# Patient Record
Sex: Female | Born: 1990 | Race: White | Hispanic: No | Marital: Single
Health system: Southern US, Community
[De-identification: ages and names within clinical notes are randomized; demographics above are authoritative.]

---

## 2017-01-03 ENCOUNTER — Emergency Department (HOSPITAL_COMMUNITY)
Admission: EM | Admit: 2017-01-03 | Discharge: 2017-01-05 | Disposition: A | Discharge status: Home / Self Care | Payer: Self-pay | Attending: Emergency Medicine | Admitting: Emergency Medicine

## 2017-01-03 ENCOUNTER — Emergency Department (HOSPITAL_COMMUNITY): Payer: Self-pay

## 2017-01-03 ENCOUNTER — Encounter (HOSPITAL_COMMUNITY): Payer: Self-pay

## 2017-01-03 DIAGNOSIS — F19951 Other psychoactive substance use, unspecified with psychoactive substance-induced psychotic disorder with hallucinations: Secondary | ICD-10-CM | POA: Diagnosis present

## 2017-01-03 DIAGNOSIS — F1910 Other psychoactive substance abuse, uncomplicated: Secondary | ICD-10-CM | POA: Insufficient documentation

## 2017-01-03 DIAGNOSIS — R443 Hallucinations, unspecified: Secondary | ICD-10-CM

## 2017-01-03 LAB — ACETAMINOPHEN LEVEL: Acetaminophen (Tylenol), Serum: <10 ug/mL — ABNORMAL LOW (ref 10–30)

## 2017-01-03 LAB — COMPREHENSIVE METABOLIC PANEL
ALK PHOS: 53 U/L (ref 38–126)
ALT: 16 U/L (ref 14–54)
AST: 16 U/L (ref 15–41)
Albumin: 3.4 g/dL — ABNORMAL LOW (ref 3.5–5.0)
Anion gap: 5 (ref 5–15)
BILIRUBIN TOTAL: 0.3 mg/dL (ref 0.3–1.2)
BUN: 11 mg/dL (ref 6–20)
CALCIUM: 8.6 mg/dL — AB (ref 8.9–10.3)
CO2: 25 mmol/L (ref 22–32)
CREATININE: 0.9 mg/dL (ref 0.44–1.00)
Chloride: 108 mmol/L (ref 101–111)
GFR calc non Af Amer: >60 mL/min (ref >60)
GLUCOSE: 119 mg/dL — AB (ref 65–99)
Potassium: 4.1 mmol/L (ref 3.5–5.1)
SODIUM: 138 mmol/L (ref 135–145)
Total Protein: 6 g/dL — ABNORMAL LOW (ref 6.5–8.1)

## 2017-01-03 LAB — ETHANOL: Alcohol, Ethyl (B): <5 mg/dL (ref <5)

## 2017-01-03 LAB — CBC
HEMATOCRIT: 34.1 % — AB (ref 36.0–46.0)
HEMOGLOBIN: 11.6 g/dL — AB (ref 12.0–15.0)
MCH: 30.2 pg (ref 26.0–34.0)
MCHC: 34 g/dL (ref 30.0–36.0)
MCV: 88.8 fL (ref 78.0–100.0)
Platelets: 228 10*3/uL (ref 150–400)
RBC: 3.84 MIL/uL — ABNORMAL LOW (ref 3.87–5.11)
RDW: 13.5 % (ref 11.5–15.5)
WBC: 5.3 10*3/uL (ref 4.0–10.5)

## 2017-01-03 LAB — BLOOD GAS, ARTERIAL
ACID-BASE EXCESS: 0.7 mmol/L (ref 0.0–2.0)
BICARBONATE: 25.6 mmol/L (ref 20.0–28.0)
Drawn by: 422461
FIO2: 21
O2 Saturation: 96.2 %
PCO2 ART: 43.2 mmHg (ref 32.0–48.0)
PH ART: 7.387 (ref 7.350–7.450)
PO2 ART: 82.3 mmHg — AB (ref 83.0–108.0)
Patient temperature: 97.5

## 2017-01-03 LAB — I-STAT BETA HCG BLOOD, ED (MC, WL, AP ONLY): I-stat hCG, quantitative: <5 m[IU]/mL (ref <5)

## 2017-01-03 LAB — SALICYLATE LEVEL

## 2017-01-03 MED ORDER — THIAMINE HCL 100 MG/ML IJ SOLN: 100.0000 mg | Freq: Once | INTRAMUSCULAR | Status: DC

## 2017-01-03 MED ORDER — SODIUM CHLORIDE 0.9 % IV BOLUS (SEPSIS)
1000.0000 mL | Freq: Once | INTRAVENOUS | Status: AC
  Administered 2017-01-04: 1000 mL via INTRAVENOUS

## 2017-01-03 NOTE — ED Notes (Signed)
Bed: WA17 Expected date:  Expected time:  Means of arrival:  Comments: Hold for triage 4 

## 2017-01-03 NOTE — ED Notes (Signed)
IV team unsuccessful. EDP notified. Respiratory to do arterial stick.

## 2017-01-03 NOTE — ED Notes (Signed)
Patient transported to X-ray 

## 2017-01-03 NOTE — ED Provider Notes (Signed)
[facility]-EM[facility]ENCY DEPT Provider Note   CSN: [id number] Arrival date & time: 01/03/17  1815     History   Chief Complaint Chief Complaint  Patient presents with  . IVC  . Hallucinations  . Possible overdose    HPI Kristina Wells is a 26 y.o. female.  HPI Patient presents from substance abuse treatment with hallucinations and somewhat decreased mental status. States she will use every drug that was primarily alcohol,opiates and benzos. Also will use methamphetamine. Brought in under IVC because she reportedly could not contract for safety.. Had been confused. States she has had hallucinations with withdrawal and while she's been using her methamphetamine. She is on Neurontin reportedly took it today. Denies suicidal or homicidal thoughts.states that she last used a week ago.she injects drugs and states there are no veins left. History reviewed. No pertinent past medical history.  There are no active problems to display for this patient.   History reviewed. No pertinent surgical history.  OB History    No data available       Home Medications    Prior to Admission medications   Not on File    Family History No family history on file.  Social History Social History  Substance Use Topics  . Smoking status: Not on file  . Smokeless tobacco: Not on file  . Alcohol use Not on file     Allergies   Sulfa antibiotics   Review of Systems Review of Systems  Constitutional: Positive for appetite change. Negative for fever.  HENT: Negative for congestion.   Respiratory: Negative for shortness of breath.   Cardiovascular: Negative for chest pain.  Gastrointestinal: Negative for abdominal pain.  Genitourinary: Negative for flank pain.  Musculoskeletal: Negative for back pain.  Skin: Negative for rash and wound.  Neurological: Negative for speech difficulty.  Psychiatric/Behavioral: Positive for hallucinations.     Physical Exam Updated Vital Signs BP (!) 87/49    Pulse 64   Temp (!) 97.5 F (36.4 C) (Oral)   Resp 12   LMP 12/03/2016 (Approximate)   SpO2 96%   Physical Exam  Constitutional: She appears well-developed.  HENT:  Head: Atraumatic.  Eyes: Pupils are equal, round, and reactive to light.  Neck: Neck supple.  Cardiovascular: Normal rate.   Pulmonary/Chest: Effort normal.  Abdominal: Soft. There is no tenderness.  Musculoskeletal: She exhibits no edema.  Neurological: She is alert.  Skin: Skin is warm. Capillary refill takes less than 2 seconds.  Psychiatric: She has a normal mood and affect.     ED Treatments / Results  Labs (all labs ordered are listed, but only abnormal results are displayed) Labs Reviewed  COMPREHENSIVE METABOLIC PANEL - Abnormal; Notable for the following:       Result Value   Glucose, Bld 119 (*)    Calcium 8.6 (*)    Total Protein 6.0 (*)    Albumin 3.4 (*)    All other components within normal limits  ACETAMINOPHEN LEVEL - Abnormal; Notable for the following:    Acetaminophen (Tylenol), Serum <10 (*)    All other components within normal limits  CBC - Abnormal; Notable for the following:    RBC 3.84 (*)    Hemoglobin 11.6 (*)    HCT 34.1 (*)    All other components within normal limits  BLOOD GAS, ARTERIAL - Abnormal; Notable for the following:    pO2, Arterial 82.3 (*)    All other components within normal limits  ETHANOL  SALICYLATE LEVEL  RAPID URINE DRUG SCREEN, HOSP PERFORMED  URINALYSIS, ROUTINE W REFLEX MICROSCOPIC  I-STAT BETA HCG BLOOD, ED ([facility], [facility], [facility] ONLY)    EKG  EKG Interpretation None       Radiology Dg Chest 2 View  Result Date: 01/03/2017 CLINICAL DATA:  Lethargy. EXAM: CHEST  2 VIEW COMPARISON:  None. FINDINGS: The heart size and mediastinal contours are within normal limits. Both lungs are clear. The visualized skeletal structures are unremarkable. IMPRESSION: No active cardiopulmonary disease. Electronically Signed   By: Gerome Sam III M.D   On: 01/03/2017  20:18    Procedures Procedures (including critical care time)  Medications Ordered in ED Medications  sodium chloride 0.9 % bolus 1,000 mL (0 mLs Intravenous Hold 01/03/17 2208)  thiamine (B-1) injection 100 mg (0 mg Intravenous Hold 01/03/17 2208)     Initial Impression / Assessment and Plan / ED Course  I have reviewed the triage vital signs and the nursing notes.  Pertinent labs & imaging results that were available during my care of the patient were reviewed by me and considered in my medical decision making (see chart for details).     Patient with substance abuse. Currently in treatment. Has reportedly had hallucinations. However had these wash was still using her drugs also.labs so far reassuring but urinalysis pending. Difficult IV access due to IV drug abuse. Will encourage oral fluids. Doubt this is delirium tremens or hallucinations from alcohol withdrawal. Normal heart rate and no tremors. Will monitor and care turned over to Dr.Palumbo . Final Clinical Impressions(s) / ED Diagnoses   Final diagnoses:  Hallucinations  Substance abuse    New Prescriptions New Prescriptions   No medications on file     Benjiman Core, MD 01/04/17 0020

## 2017-01-03 NOTE — ED Notes (Signed)
Unable to find IV access, charge nurse at bedside

## 2017-01-03 NOTE — ED Notes (Signed)
Bed: [facility] Expected date:  Expected time:  Means of arrival:  Comments: 

## 2017-01-03 NOTE — ED Triage Notes (Signed)
Pt presents via [employer] under IVC papers with c/o hallucinations and possible overdose. IVC papers state "respondant is a 26 yo female who is being treated for substance abuse. Upon questioning guest will not contract for safety. She endorses auditory hallucinations which caused her to fear someone was entering her room to hurt her. Patient grabbed a shampoo bottle as a weapon to defend herself. 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 Guest is currently threatening to leave treatment". Upon triaging pt, pt is noted to be very lethargic, is able to be aroused but falls back asleep in chair almost immediately. Pt says she took gabapentin today but she is not a good historian as to how much she actually took.

## 2017-01-04 DIAGNOSIS — F19951 Other psychoactive substance use, unspecified with psychoactive substance-induced psychotic disorder with hallucinations: Secondary | ICD-10-CM | POA: Diagnosis present

## 2017-01-04 LAB — COMPREHENSIVE METABOLIC PANEL
ALBUMIN: 2.8 g/dL — AB (ref 3.5–5.0)
ALK PHOS: 46 U/L (ref 38–126)
ALT: 13 U/L — ABNORMAL LOW (ref 14–54)
ANION GAP: 3 — AB (ref 5–15)
AST: 16 U/L (ref 15–41)
BILIRUBIN TOTAL: 0.1 mg/dL — AB (ref 0.3–1.2)
BUN: 8 mg/dL (ref 6–20)
CALCIUM: 8 mg/dL — AB (ref 8.9–10.3)
CO2: 25 mmol/L (ref 22–32)
CREATININE: 0.8 mg/dL (ref 0.44–1.00)
Chloride: 111 mmol/L (ref 101–111)
GFR calc Af Amer: >60 mL/min (ref >60)
GFR calc non Af Amer: >60 mL/min (ref >60)
GLUCOSE: 129 mg/dL — AB (ref 65–99)
Potassium: 4.1 mmol/L (ref 3.5–5.1)
Sodium: 139 mmol/L (ref 135–145)
TOTAL PROTEIN: 5.1 g/dL — AB (ref 6.5–8.1)

## 2017-01-04 LAB — CBC WITH DIFFERENTIAL/PLATELET
BASOS PCT: 0 %
Basophils Absolute: 0 10*3/uL (ref 0.0–0.1)
Eosinophils Absolute: 0.1 10*3/uL (ref 0.0–0.7)
Eosinophils Relative: 3 %
HEMATOCRIT: 34.1 % — AB (ref 36.0–46.0)
HEMOGLOBIN: 11.4 g/dL — AB (ref 12.0–15.0)
Lymphocytes Relative: 39 %
Lymphs Abs: 1.8 10*3/uL (ref 0.7–4.0)
MCH: 30.2 pg (ref 26.0–34.0)
MCHC: 33.4 g/dL (ref 30.0–36.0)
MCV: 90.2 fL (ref 78.0–100.0)
MONOS PCT: 5 %
Monocytes Absolute: 0.2 10*3/uL (ref 0.1–1.0)
Neutro Abs: 2.4 10*3/uL (ref 1.7–7.7)
Neutrophils Relative %: 53 %
Platelets: 194 10*3/uL (ref 150–400)
RBC: 3.78 MIL/uL — ABNORMAL LOW (ref 3.87–5.11)
RDW: 13.5 % (ref 11.5–15.5)
WBC: 4.6 10*3/uL (ref 4.0–10.5)

## 2017-01-04 LAB — URINALYSIS, ROUTINE W REFLEX MICROSCOPIC
BILIRUBIN URINE: NEGATIVE
Bacteria, UA: NONE SEEN
GLUCOSE, UA: NEGATIVE mg/dL
Hgb urine dipstick: NEGATIVE
KETONES UR: NEGATIVE mg/dL
LEUKOCYTES UA: NEGATIVE
NITRITE: NEGATIVE
PH: 5.5 (ref 5.0–8.0)
Protein, ur: NEGATIVE mg/dL
Specific Gravity, Urine: 1.025 (ref 1.005–1.030)

## 2017-01-04 LAB — RAPID URINE DRUG SCREEN, HOSP PERFORMED
Amphetamines: POSITIVE — AB
BARBITURATES: NOT DETECTED
Benzodiazepines: POSITIVE — AB
Cocaine: NOT DETECTED
Opiates: POSITIVE — AB
Tetrahydrocannabinol: NOT DETECTED

## 2017-01-04 MED ORDER — SODIUM CHLORIDE 0.9 % IV BOLUS (SEPSIS)
1000.0000 mL | Freq: Once | INTRAVENOUS | Status: AC
  Administered 2017-01-04: 1000 mL via INTRAVENOUS

## 2017-01-04 MED ORDER — SODIUM CHLORIDE 0.9 % IV BOLUS (SEPSIS)
2000.0000 mL | Freq: Once | INTRAVENOUS | Status: AC
  Administered 2017-01-04: 2000 mL via INTRAVENOUS

## 2017-01-04 MED ORDER — ACETAMINOPHEN 325 MG PO TABS: 650.0000 mg | ORAL_TABLET | ORAL | Status: DC | PRN

## 2017-01-04 NOTE — ED Provider Notes (Signed)
Patient arousable has a complaint of dizziness. Was ambulatory here. Blood pressure results noted. Will recheck labs here as well as recheck orthostatics. Dr. Fayrene Fearing to follow   Lorre Nick, MD 01/04/17 [phone]

## 2017-01-04 NOTE — BHH Counselor (Signed)
TTS received report from pt's nurse and charge nurse on behalf of pt. Clinician noted pt is psychiatrically cleared. Clinician will follow up with [facility].    Redmond Pulling, MS, [facility], [facility] Triage Specialist [phone]

## 2017-01-04 NOTE — ED Notes (Signed)
Pt father called and was advised that pt is stable and being evaluated. Father is concerned that pt will leave. He was advised that pt has IVC papers at this time and is safe. He will call later for follow up

## 2017-01-04 NOTE — ED Notes (Signed)
ASSUMED CARE OF PT. AAOX3. PT IN NO APPARENT DISTRESS OR PAIN. IV X1 ATTEMPT UNSUCCESSFUL. PT REFUSED ANOTHER ATTEMPT. PT ENCOURAGED TO DRINK PLENTY OF FLUIDS.  AWAITING FURTHER ORDERS.

## 2017-01-04 NOTE — ED Notes (Signed)
Clonidine patch removed from pt L back that was placed by [facility]. Charge RN and Dr Fayrene Fearing made aware

## 2017-01-04 NOTE — ED Notes (Signed)
Pt lethargic but answers questions appropriately. Pt request her breakfast tray which was given. Pt has sitter at bedside. Pt in NAD

## 2017-01-04 NOTE — BHH Suicide Risk Assessment (Signed)
Suicide Risk Assessment  Discharge Assessment   [facility] Discharge Suicide Risk Assessment   Principal Problem: Substance-induced psychotic disorder with hallucinations [facility]) Discharge Diagnoses:  Patient Active Problem List   Diagnosis Date Noted  . Substance-induced psychotic disorder with hallucinations [facility]) [F19.951] 01/04/2017    Priority: High    Total Time spent with patient: 45 minutes  Musculoskeletal: Strength & Muscle Tone: within normal limits Gait & Station: normal Patient leans: N/A  Psychiatric Specialty Exam:   Blood pressure (!) 91/52, pulse 70, temperature (!) 97.5 F (36.4 C), temperature source Oral, resp. rate 16, last menstrual period 12/03/2016, SpO2 96 %.There is no height or weight on file to calculate BMI.  General Appearance: Casual  Eye Contact::  Good  Speech:  Normal Rate409  Volume:  Normal  Mood:  Euthymic  Affect:  Congruent  Thought Process:  Coherent and Descriptions of Associations: Intact  Orientation:  Full (Time, Place, and Person)  Thought Content:  WDL and Logical  Suicidal Thoughts:  No  Homicidal Thoughts:  No  Memory:  Immediate;   Good Recent;   Good Remote;   Good  Judgement:  Fair  Insight:  Fair  Psychomotor Activity:  Normal  Concentration:  Good  Recall:  Good  Fund of Knowledge:Fair  Language: Good  Akathisia:  No  Handed:  Right  AIMS (if indicated):     Assets:  Leisure Time Physical Health Resilience  Sleep:     Cognition: WNL  ADL's:  Intact   Mental Status Per Nursing Assessment::   On Admission:   Amphetamine use with vague hallucinations prior to admission.  Denies suicidal/homicidal ideations, hallucinations, or withdrawal symptoms.  Psychiatrically clear for discharge.  Demographic Factors:  Caucasian  Loss Factors: Legal issues  Historical Factors: NA  Risk Reduction Factors:   Sense of responsibility to family, Positive social support and Positive therapeutic relationship  Continued  Clinical Symptoms:  None   Cognitive Features That Contribute To Risk:  None    Suicide Risk:  Minimal: No identifiable suicidal ideation.  Patients presenting with no risk factors but with morbid ruminations; may be classified as minimal risk based on the severity of the depressive symptoms    Plan Of Care/Follow-up recommendations:  Activity:  as tolerated Diet:  heart healthy diet  Derion Kreiter, NP 01/04/2017, 11:46 AM

## 2017-01-04 NOTE — ED Notes (Signed)
Pt father's contact # [phone] Elijah Birk

## 2017-01-04 NOTE — ED Notes (Signed)
Dr Freida Busman made aware of pts orthostatic VS

## 2017-01-04 NOTE — ED Notes (Signed)
Pt requests meal tray which was provided

## 2017-01-04 NOTE — BH Assessment (Signed)
Assessment Note  Kristina Wells is an 26 y.o. female.Pt reports she is from [place]. Pt was brought from [facility] due to concerns about hallucinations.  Pt reports she is seeing "figures" outside her door at [facility] and she can hear them whispering about "coming to get me."  Pt reports she started using meth heavily about a month ago and the hallucinations started at that point.  Pt very drowsy during assessment but was able to answer questions.  Pt reports she was arrested on multiple charges 1 month ago, spent 2 weeks in jail, and went straight to [facility] afterwards. Pt reports there were drugs in jail that she used while there: heroin and benzos. Pt reports use of heroin, meth, benzos, cocaine and alcohol. Current UDS positive for amphetamine, benzos, opiates. Pt denies SI/HI.  Pt has been in residential substance abuse treatment several times: [place] in 2017, [place] 2013.  Pt denies specific mental health treatment.    Diagnosis: substance use disorder: opiate, benzo, cocaine, methamphetamine  Past Medical History: History reviewed. No pertinent past medical history.  History reviewed. No pertinent surgical history.  Family History: No family history on file.  Social History:  has no tobacco, alcohol, and drug history on file.  Additional Social History:  Alcohol / Drug Use Pain Medications: heroin Prescriptions: benzos History of alcohol / drug use?: Yes Negative Consequences of Use: Financial, Legal, Personal relationships, Work / School Withdrawal Symptoms: Seizures (pt denies current withdrawals) Date of most recent seizure: 2013 Substance #1 Name of Substance 1: heroin 1 - Age of First Use: 16 1 - Amount (size/oz): 1/2 gram 1 - Frequency: daily 1 - Duration: 5 years 1 - Last Use / Amount: 2 weeks ago-in jail Substance #2 Name of Substance 2: benzos-xanax 2 - Age of First Use: 17 2 - Amount (size/oz): 5-10 pills 2 - Frequency: daily 2 - Duration:   5 years 2 - Last Use / Amount: 2 weeks ago in jail Substance #3 Name of Substance 3: cocaine 3 - Age of First Use: 20 3 - Amount (size/oz): $50 3 - Frequency: daily 3 - Duration: 1 week.  Prior to this, used sporadically 3 - Last Use / Amount: 1 month ago Substance #4 Name of Substance 4: meth 4 - Age of First Use: 20 4 - Amount (size/oz): $50 4 - Frequency: daily 4 - Duration: 1 year 4 - Last Use / Amount: 1 month ago-pt reports she started using meth daily/heavily in the past month Substance #5 Name of Substance 5: alcohol 5 - Age of First Use: 15 5 - Amount (size/oz): 12 pack beer, 1 bottle wine 5 - Frequency: daily 5 - Duration: 1 year 5 - Last Use / Amount: 1 month ago  CIWA: CIWA-Ar BP: (!) 82/57 Pulse Rate: (!) 51 COWS:    Allergies:  Allergies  Allergen Reactions  . Sulfa Antibiotics     Home Medications:  (Not in a hospital admission)  OB/GYN Status:  Patient's last menstrual period was 12/03/2016 (approximate).  General Assessment Data [facility] of Assessment: [facility] TTS Assessment: In system Is this a Tele or Face-to-Face Assessment?: Face-to-Face Is this an Initial Assessment or a Re-assessment for this encounter?: Initial Assessment Marital status: Single Living Arrangements: Spouse/significant other Can pt return to current living arrangement?: Yes Admission Status: Involuntary Referral Source: Other Systems analyst)     Crisis Care Plan Living Arrangements: Spouse/significant other Name of Psychiatrist: none reported Name of Therapist: none reported  Risk to self with the past 6 months Suicidal Ideation: No Has patient been a risk to self within the past 6 months prior to admission? : No Suicidal Intent: No Has patient had any suicidal intent within the past 6 months prior to admission? : No Is patient at risk for suicide?: No Suicidal Plan?: No Has patient had any suicidal plan within the past 6 months prior to admission? :  No Access to Means: No What has been your use of drugs/alcohol within the last 12 months?: came directly from [facility]-been there 2 weeks Previous Attempts/Gestures: No Intentional Self Injurious Behavior: None Family Suicide History: No Recent stressful life event(s): Other (Comment) (parents are divorcing, would not give other stressors) Persecutory voices/beliefs?: Yes Depression: No Substance abuse history and/or treatment for substance abuse?: Yes  Risk to Others within the past 6 months Homicidal Ideation: No Does patient have any lifetime risk of violence toward others beyond the six months prior to admission? : No Thoughts of Harm to Others: No Current Homicidal Intent: No Current Homicidal Plan: No Access to Homicidal Means: No History of harm to others?: No Assessment of Violence: None Noted Does patient have access to weapons?: No Criminal Charges Pending?: Yes Describe Pending Criminal Charges: B and E, larceny, resisting arrest Does patient have a court date: Yes Court Date: 02/19/17 Is patient on probation?: Yes  Psychosis Hallucinations: Auditory, Visual Delusions: None noted  Mental Status Report Appearance/Hygiene: Disheveled Eye Contact: Poor Motor Activity: Restlessness Speech: Slurred Level of Consciousness: Drowsy, Irritable Mood: Irritable Affect: Unable to Assess Anxiety Level: None Thought Processes: Relevant Judgement: Impaired Orientation: Person, Place, Time, Situation Obsessive Compulsive Thoughts/Behaviors: None  Cognitive Functioning Concentration: Unable to Assess Memory: Recent Intact, Remote Intact IQ: Average Insight: Fair Impulse Control: Poor Appetite: Fair Weight Loss: 0 Weight Gain:  ("a whole lot") Sleep: Decreased Total Hours of Sleep:  (unknown-on medication) Vegetative Symptoms: None  ADLScreening [facility] Assessment Services) Patient's cognitive ability adequate to safely complete daily activities?: Yes Patient  able to express need for assistance with ADLs?: Yes Independently performs ADLs?: Yes (appropriate for developmental age)  Prior Inpatient Therapy Prior Inpatient Therapy: Yes Prior Therapy Dates: current (also residential in [place], [place] 2017, [place] 2013) Prior Therapy Facilty/Provider(s): [facility] Reason for Treatment: substance abuse  Prior Outpatient Therapy Prior Outpatient Therapy: No Does patient have an ACCT team?: No Does patient have Intensive In-House Services?  : No Does patient have [facility] services? : No Does patient have P4CC services?: No  ADL Screening (condition at time of admission) Patient's cognitive ability adequate to safely complete daily activities?: Yes Patient able to express need for assistance with ADLs?: Yes Independently performs ADLs?: Yes (appropriate for developmental age)             Merchant navy officer (For Healthcare) Does Patient Have a Medical Advance Directive?: No    Additional Information 1:1 In Past 12 Months?:  (unknown) CIRT Risk: Yes Elopement Risk: Yes Does patient have medical clearance?: Yes     Disposition:  Disposition Initial Assessment Completed for this Encounter: Yes  On Site Evaluation by:   Reviewed with Physician:    Lorri Frederick 01/04/2017 8:26 AM

## 2017-01-04 NOTE — ED Notes (Signed)
Report to [facility] in [place]

## 2017-01-04 NOTE — ED Notes (Signed)
Spoke with patient's father, Jackalynn Lindblom at [phone] and he is aware patient will be staying in the TCU overnight

## 2017-01-04 NOTE — BHH Counselor (Signed)
Clinician contacted [facility] [phone]) to speak to a nurse. Clinician spoke to [facility] and was informed the pt can not come back to [facility]. Dorene Grebe reported, she informed pt's father and [facility] charge nurse of the disposition. Dorene Grebe reported, she provided the pt's father with five referrals to other female programs, that he will have to complete and submit for the pt. Dorene Grebe also expressed the pt's belonging are currently locked up. Clinician followed up with charge nurse   Redmond Pulling, MS, [facility], [facility] Triage Specialist [phone]

## 2017-01-04 NOTE — ED Notes (Addendum)
Per Consulting civil engineer at [facility], pt was recently placed on Zyprexa a few days ago after reporting hallucinations. RN reports that pt became lethargic throughout the day and was suspected of ingesting other substances, but could not confirm because pt arrival UDS was positive. Rn sts to check for clonidine patch that could be causing her to have low BP. Pt reported to [facility] that her last IV meth use was the 21st. Dorene Grebe, Consulting civil engineer reports that pt will more than likely not be accepted back to facility d/t her previous behavior and outbursts. Dr Fayrene Fearing made aware

## 2017-01-04 NOTE — ED Notes (Signed)
Pt observed to ambulate without difficulty and with steady gait to BR and back to room.

## 2017-01-04 NOTE — BHH Counselor (Signed)
Clinician contacted [facility] [phone]) and spoke to Sam in admissions. Clinician noted from Sam that the medical team will be referring the pt to another treatment facility. Sam attempted to connect clinician to nurses however clinician was informed to call back in about fifteen minutes to speak to nurses.   Redmond Pulling, MS, [facility], [facility] Triage Specialist [phone]

## 2017-01-04 NOTE — ED Notes (Signed)
Bed: WA26 Expected date:  Expected time:  Means of arrival:  Comments: Room 17 

## 2017-01-04 NOTE — ED Notes (Signed)
Pt denies thoughts of harming herself at this time

## 2017-01-04 NOTE — ED Provider Notes (Signed)
Patient examined. Clonidine patch removed. Given additional fluids. Pulse and blood pressures improved. She is more awake and alert. She is unable to return to [facility].  Patient will be observed. Not actively currently suicidal. Last for a.m. Case IT consultant evaluation for disposition.   Rolland Porter, MD 01/04/17 2253

## 2017-01-05 NOTE — Progress Notes (Signed)
CSW spoke with patient and patients father regarding discharge plans. Patients father is to pick up patient for discharge and patients belongings at [facility]. CSW provided patient with contact information for [facility] to address her concerns. CSW rescinded IVC paperwork. CSW updated EDP and RN.   Stacy Gardner, [facility] Clinical Social Worker [phone]

## 2017-01-05 NOTE — ED Provider Notes (Addendum)
  Physical Exam  BP 107/65 (BP Location: Right Arm)   Pulse 78   Temp 97.7 F (36.5 C) (Oral)   Resp 15   LMP 12/03/2016 (Approximate)   SpO2 98%   Physical Exam  ED Course  Procedures  MDM Patient came here for cocaine detox. Was under IVC but was rescinded. She was told that she can't go back to [facility] and family doesn't want to take her home. Case manager and social worker were involved yesterday regarding shelter placement. Will follow up with them regarding dispo today.   8:53 AM Social work talked to patient and verified with [facility] that she can't go back. She denied relapsing. She was given list of shelters as family doesn't want to take her home. Clinically sober.        Charlynne Pander, MD 01/05/17 3299    Charlynne Pander, MD 01/05/17 [phone]

## 2017-01-05 NOTE — Discharge Instructions (Signed)
Stop using drugs.   You were provided a list of resources.   See your doctor  Return to ER if you have thoughts of harming yourself or others, worse hallucinations

## 2017-04-11 DEATH — deceased

## 2018-09-20 IMAGING — CR DG CHEST 2V
2 series · 2 of 2 positions shown · non-contrast
Comparison: None.

CLINICAL DATA: Lethargy.

EXAM:
CHEST  2 VIEW

[w chest lat]
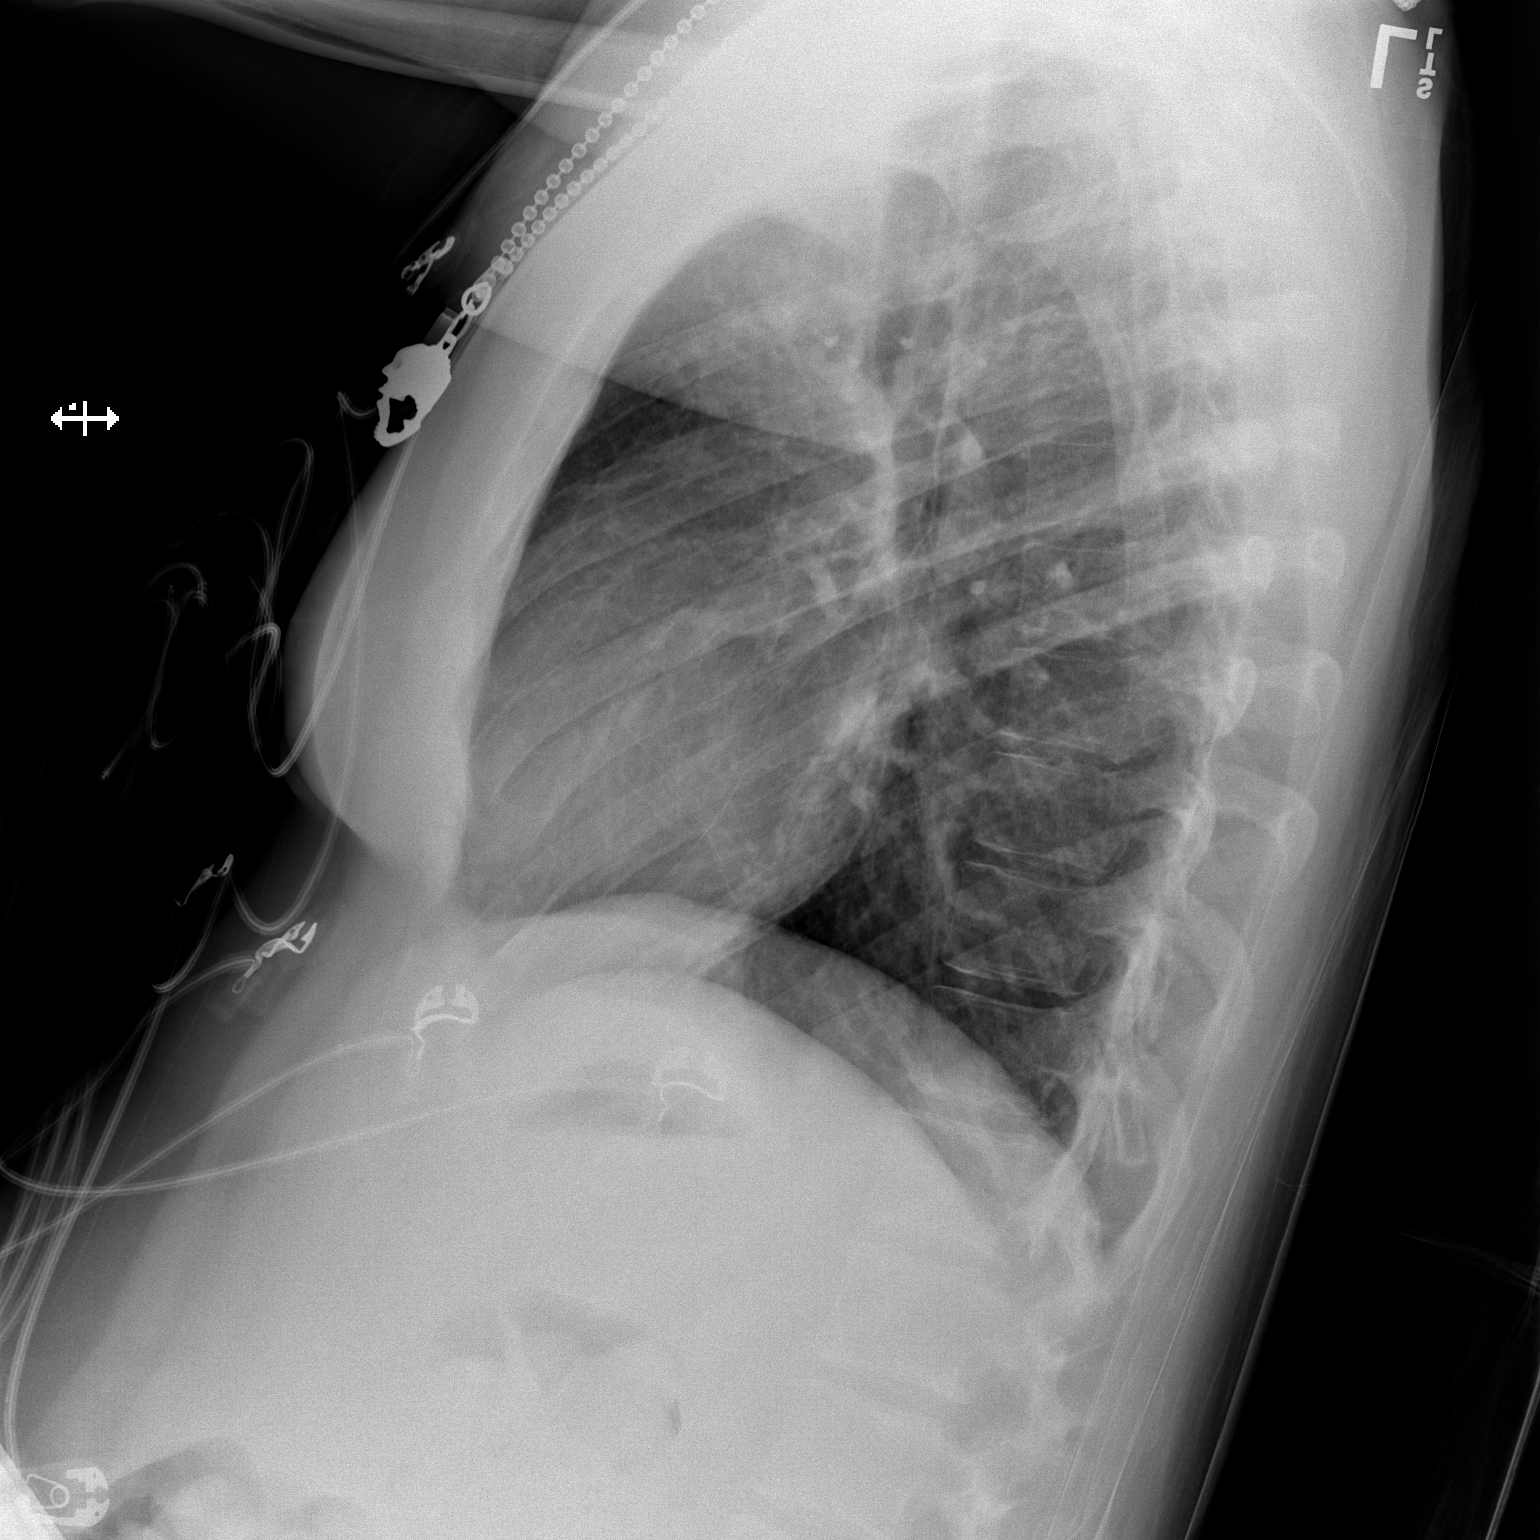

[x chest ap]
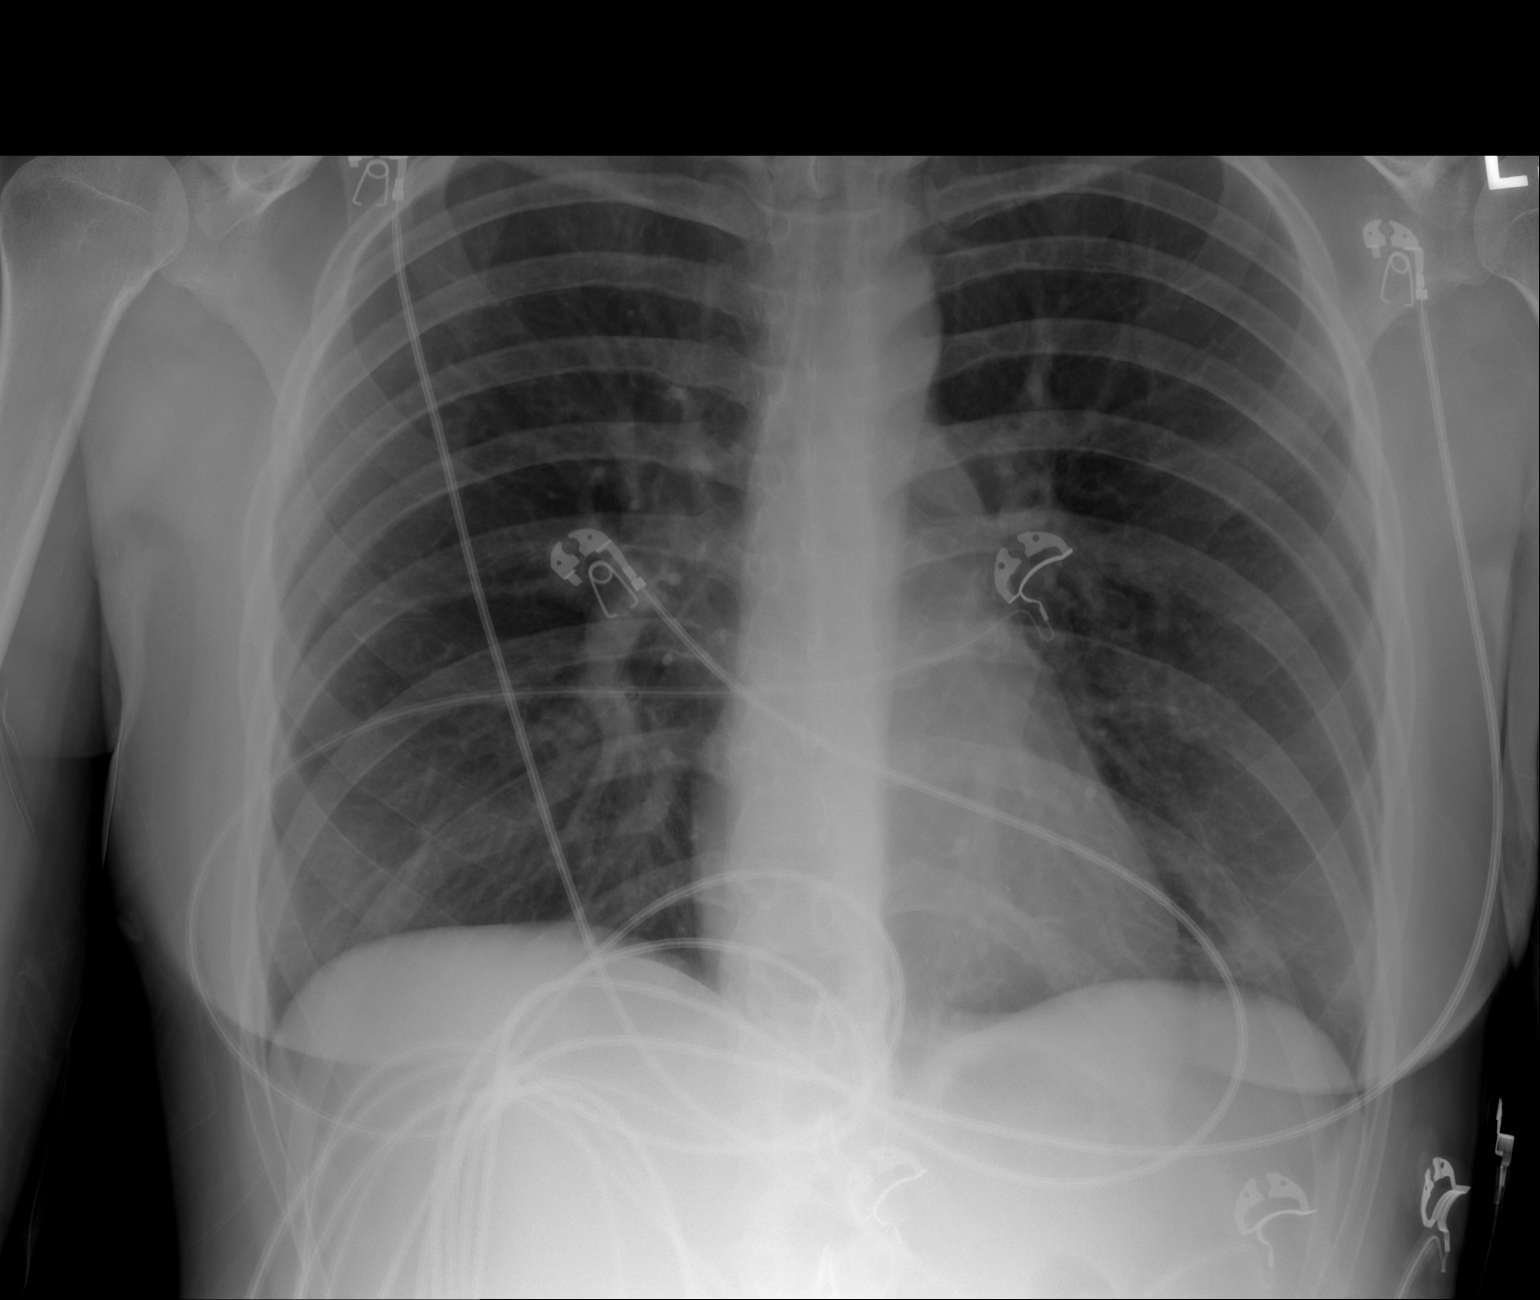

[2 of 2 positions shown; findings below may reference images not displayed]

FINDINGS: The heart size and mediastinal contours are within normal limits.
Both lungs are clear. The visualized skeletal structures are
unremarkable.
IMPRESSION: No active cardiopulmonary disease.
